# Patient Record
Sex: Male | Born: 2000 | Race: White | Hispanic: No | Marital: Single | Smoking: Current some day smoker
Health system: Southern US, Community
[De-identification: ages and names within clinical notes are randomized; demographics above are authoritative.]

---
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
## 2022-04-04 ENCOUNTER — Other Ambulatory Visit: Payer: Self-pay

## 2022-04-04 ENCOUNTER — Emergency Department (HOSPITAL_COMMUNITY)
Admission: EM | Admit: 2022-04-04 | Discharge: 2022-04-04 | Disposition: A | Discharge status: Home / Self Care | Payer: Worker's Compensation | Attending: Emergency Medicine | Admitting: Emergency Medicine

## 2022-04-04 ENCOUNTER — Encounter (HOSPITAL_COMMUNITY): Payer: Self-pay

## 2022-04-04 DIAGNOSIS — S61412A Laceration without foreign body of left hand, initial encounter: Secondary | ICD-10-CM | POA: Insufficient documentation

## 2022-04-04 DIAGNOSIS — W268XXA Contact with other sharp object(s), not elsewhere classified, initial encounter: Secondary | ICD-10-CM | POA: Diagnosis not present

## 2022-04-04 DIAGNOSIS — Z23 Encounter for immunization: Secondary | ICD-10-CM | POA: Insufficient documentation

## 2022-04-04 DIAGNOSIS — Y990 Civilian activity done for income or pay: Secondary | ICD-10-CM | POA: Diagnosis not present

## 2022-04-04 DIAGNOSIS — S6992XA Unspecified injury of left wrist, hand and finger(s), initial encounter: Secondary | ICD-10-CM | POA: Diagnosis present

## 2022-04-04 MED ORDER — TETANUS-DIPHTH-ACELL PERTUSSIS 5-2.5-18.5 LF-MCG/0.5 IM SUSY
0.5000 mL | PREFILLED_SYRINGE | Freq: Once | INTRAMUSCULAR | Status: AC
  Administered 2022-04-04: 0.5 mL via INTRAMUSCULAR
  Filled 2022-04-04: qty 0.5

## 2022-04-04 NOTE — ED Provider Notes (Signed)
[facility] DEPT Provider Note   CSN: [id number] Arrival date & time: 04/04/22  1025     History  Chief Complaint  Patient presents with   Laceration    Randy Berry is a 21 y.o. male.  21 year old male with laceration to the left hand.  States he cut his hand on a sharp pipe at work.  Bleeding is controlled prior to arrival.  Last tetanus is unknown.  No other injuries.  Denies weakness or numbness in the hand.       Home Medications Prior to Admission medications   Not on File      Allergies    Patient has no known allergies.    Review of Systems   Review of Systems Negative except as per HPI Physical Exam Updated Vital Signs BP (!) 143/95 (BP Location: Left Arm)   Pulse 78   Temp 98.7 F (37.1 C) (Oral)   Ht 5\' 11"  (1.803 m)   Wt 104.3 kg   SpO2 100%   BMI 32.08 kg/m  Physical Exam Vitals and nursing note reviewed.  Constitutional:      General: He is not in acute distress.    Appearance: He is well-developed. He is not diaphoretic.  HENT:     Head: Normocephalic and atraumatic.  Cardiovascular:     Pulses: Normal pulses.  Pulmonary:     Effort: Pulmonary effort is normal.  Musculoskeletal:        General: Signs of injury present. No swelling or deformity. Normal range of motion.     Comments: Less than 1 cm "V" shape laceration to dorsum of left hand along 1st metacarpal. Mild active bleeding, sensation intact, ROM/motor intact.   Skin:    General: Skin is warm and dry.     Findings: No erythema or rash.  Neurological:     Mental Status: He is alert and oriented to person, place, and time.     Sensory: No sensory deficit.     Motor: No weakness.  Psychiatric:        Behavior: Behavior normal.     ED Results / Procedures / Treatments   Labs (all labs ordered are listed, but only abnormal results are displayed) Labs Reviewed - No data to display  EKG None  Radiology No results  found.  Procedures .Marland KitchenLaceration Repair  Date/Time: 04/04/2022 10:49 AM  Performed by: Tacy Learn, PA-C Authorized by: Tacy Learn, PA-C   Consent:    Consent obtained:  Verbal   Consent given by:  Patient   Risks discussed:  Infection, need for additional repair, pain, poor cosmetic result and poor wound healing   Alternatives discussed:  No treatment and delayed treatment Universal protocol:    Procedure explained and questions answered to patient or proxy's satisfaction: yes     Relevant documents present and verified: yes     Test results available: yes     Imaging studies available: yes     Required blood products, implants, devices, and special equipment available: yes     Site/side marked: yes     Immediately prior to procedure, a time out was called: yes     Patient identity confirmed:  Verbally with patient Anesthesia:    Anesthesia method:  None Laceration details:    Location:  Hand   Hand location:  L wrist   Length (cm):  1   Depth (mm):  3 Pre-procedure details:    Preparation:  Patient was prepped and  draped in usual sterile fashion Exploration:    Wound exploration: wound explored through full range of motion and entire depth of wound visualized     Wound extent: no foreign bodies/material noted, no muscle damage noted, no nerve damage noted, no tendon damage noted, no underlying fracture noted and no vascular damage noted     Contaminated: no   Treatment:    Area cleansed with:  Saline   Amount of cleaning:  Standard   Irrigation solution:  Sterile saline Skin repair:    Repair method:  Staples   Number of staples:  2 Approximation:    Approximation:  Close Repair type:    Repair type:  Simple Post-procedure details:    Dressing:  Adhesive bandage   Procedure completion:  Tolerated well, no immediate complications     Medications Ordered in ED Medications  Tdap (BOOSTRIX) injection 0.5 mL (0.5 mLs Intramuscular Given 04/04/22 1041)     ED Course/ Medical Decision Making/ A&P                           Medical Decision Making Risk Prescription drug management.   63 oh male, right-hand-dominant, presents with laceration to the left wrist as above.  Wound is visualized to depth, no limitations in range of motion, sensation intact.  Discussed closure with sutures.  Patient agreeable with staples.  Wound was irrigated, closed with 2 staples without difficulty.  Band-Aid placed after closure.  Recommend recheck with Worker's Comp. provider in 2 days.  Advised to keep wound clean and dry, covered.  Recommend staple removal in 10 days with his Worker's Comp. provider.  Tetanus is updated today, last tetanus unknown.        Final Clinical Impression(s) / ED Diagnoses Final diagnoses:  Laceration of left hand, foreign body presence unspecified, initial encounter    Rx / DC Orders ED Discharge Orders     None         Tacy Learn, PA-C 04/04/22 1051    Tretha Sciara, MD 04/04/22 1132

## 2022-04-04 NOTE — ED Triage Notes (Signed)
Patient reports that he cut his right thumb/hand with a piece of metal at work today.

## 2022-04-04 NOTE — Discharge Instructions (Signed)
Recheck with your workers comp provider in 2 days, staple removal in 10 days. Keep hand clean and dry.

## 2024-05-23 ENCOUNTER — Encounter: Payer: Self-pay | Admitting: Emergency Medicine

## 2024-05-23 ENCOUNTER — Ambulatory Visit
Admission: EM | Admit: 2024-05-23 | Discharge: 2024-05-23 | Disposition: A | Discharge status: Home / Self Care | Source: Home / Self Care

## 2024-05-23 DIAGNOSIS — J209 Acute bronchitis, unspecified: Secondary | ICD-10-CM | POA: Diagnosis not present

## 2024-05-23 LAB — POC COVID19/FLU A&B COMBO
Covid Antigen, POC: NEGATIVE
Influenza A Antigen, POC: NEGATIVE
Influenza B Antigen, POC: NEGATIVE

## 2024-05-23 MED ORDER — PREDNISONE 10 MG (21) PO TBPK: ORAL_TABLET | Freq: Every day | ORAL | 0 refills | Status: AC

## 2024-05-23 MED ORDER — AZITHROMYCIN 250 MG PO TABS: 250.0000 mg | ORAL_TABLET | Freq: Every day | ORAL | 0 refills | Status: AC

## 2024-05-23 MED ORDER — ALBUTEROL SULFATE HFA 108 (90 BASE) MCG/ACT IN AERS: 2.0000 | INHALATION_SPRAY | RESPIRATORY_TRACT | 0 refills | Status: AC | PRN

## 2024-05-23 NOTE — ED Notes (Signed)
 Patient triage by provider Teresa Shelba SAUNDERS, NP

## 2024-05-23 NOTE — ED Provider Notes (Signed)
 [facility]    CSN: [id number] Arrival date & time: 05/23/24  9062      History   Chief Complaint No chief complaint on file.   HPI COSIMO SCHERTZER is a 23 y.o. male.   Patient presents for evaluation of fever peaking at 103, nasal congestion, rhinorrhea, sore throat, productive cough with brown mucus and streaking blood, shortness of breath at rest and intermittent wheezing beginning 3 days ago.  Symptoms progressively worsening over the past 2 days.  Woke up this morning with centralized chest pain feeling as if someone was sitting on the chest wall making it difficult to breathe.  Initial sore throat has resolved.  Tolerable to food and liquids but appetite is decreased.  No known sick contacts prior.  Has attempted use of NyQuil DayQuil and Mucinex.  History of childhood asthma, sometimes tobacco use, former vapor.       No past medical history on file.  There are no active problems to display for this patient.   No past surgical history on file.     Home Medications    Prior to Admission medications  Not on File    Family History Family History  Problem Relation Age of Onset   Hypertension Father     Social History Social History[1]   Allergies   Patient has no known allergies.   Review of Systems Review of Systems  Constitutional:  Positive for fever. Negative for activity change, appetite change, chills, diaphoresis, fatigue and unexpected weight change.  HENT:  Positive for congestion, rhinorrhea and sore throat. Negative for dental problem, drooling, ear discharge, ear pain, facial swelling, hearing loss, mouth sores, nosebleeds, postnasal drip, sinus pressure, sinus pain, sneezing, tinnitus, trouble swallowing and voice change.   Respiratory:  Positive for cough, shortness of breath and wheezing. Negative for apnea, choking, chest tightness and stridor.      Physical Exam Triage Vital Signs ED Triage Vitals  Encounter Vitals Group      BP      Girls Systolic BP Percentile      Girls Diastolic BP Percentile      Boys Systolic BP Percentile      Boys Diastolic BP Percentile      Pulse      Resp      Temp      Temp src      SpO2      Weight      Height      Head Circumference      Peak Flow      Pain Score      Pain Loc      Pain Education      Exclude from Growth Chart    No data found.  Updated Vital Signs There were no vitals taken for this visit.  Visual Acuity Right Eye Distance:   Left Eye Distance:   Bilateral Distance:    Right Eye Near:   Left Eye Near:    Bilateral Near:     Physical Exam Constitutional:      Appearance: Normal appearance.  HENT:     Head: Normocephalic.     Right Ear: Tympanic membrane, ear canal and external ear normal.     Left Ear: Tympanic membrane, ear canal and external ear normal.     Nose: Congestion present.     Mouth/Throat:     Pharynx: No oropharyngeal exudate or posterior oropharyngeal erythema.  Eyes:     Extraocular Movements: Extraocular movements  intact.  Cardiovascular:     Rate and Rhythm: Normal rate and regular rhythm.     Pulses: Normal pulses.     Heart sounds: Normal heart sounds.  Pulmonary:     Effort: Pulmonary effort is normal.     Breath sounds: Normal breath sounds.  Musculoskeletal:     Cervical back: Normal range of motion and neck supple.  Skin:    General: Skin is warm and dry.  Neurological:     Mental Status: He is alert and oriented to person, place, and time. Mental status is at baseline.      UC Treatments / Results  Labs (all labs ordered are listed, but only abnormal results are displayed) Labs Reviewed  POC COVID19/FLU A&B COMBO    EKG   Radiology No results found.  Procedures Procedures (including critical care time)  Medications Ordered in UC Medications - No data to display  Initial Impression / Assessment and Plan / UC Course  I have reviewed the triage vital signs and the nursing  notes.  Pertinent labs & imaging results that were available during my care of the patient were reviewed by me and considered in my medical decision making (see chart for details).  Acute bronchitis  Vital signs are stable, O2 saturation 99% on room air, lungs clear to auscultation, based on presentation concerning for bronchitis low suspicion discussed with patient, EKG showing no sinus rhythm, no prior cardiac history low suspicion for cardiac involvement, COVID and flu testing negative, prescribed azithromycin, prednisone and albuterol inhaler, declined cough medicine, recommended over-the-counter medications and nonpharmacological supportive care and given strict precautions for any persisting or worsening symptoms to follow-up for reevaluation Final Clinical Impressions(s) / UC Diagnoses   Final diagnoses:  Viral illness   Discharge Instructions   None    ED Prescriptions   None    PDMP not reviewed this encounter.     [1]  Social History Tobacco Use   Smoking status: Some Days    Types: Cigarettes  Vaping Use   Vaping status: Every Day   Substances: Nicotine, Flavoring  Substance Use Topics   Alcohol use: Never   Drug use: Never     Teresa Shelba SAUNDERS, NP 05/23/24 1045

## 2024-05-23 NOTE — Discharge Instructions (Signed)
 You are being treated for bronchitis which is an inflammatory condition of the bronchioles (upper airways) which can cause difficulty breathing  EKG shows the heart is beating in a regular pace and rhythm and you are getting enough air without assistance, on exam your lungs are clear I have a low suspicion that you have pneumonia  As a precaution begin azithromycin which would be protective to the airway, take as directed  Begin prednisone every morning with food as directed to open and relax the airway making it easier for you to breathe  Additionally you may use inhaler taking 2 puffs every 4 hours as needed for shortness of breath and wheezing    You can take Tylenol and/or Ibuprofen as needed for fever reduction and pain relief.   For cough: honey 1/2 to 1 teaspoon (you can dilute the honey in water or another fluid).  You can also use guaifenesin and dextromethorphan for cough. You can use a humidifier for chest congestion and cough.  If you don't have a humidifier, you can sit in the bathroom with the hot shower running.      For sore throat: try warm salt water gargles, cepacol lozenges, throat spray, warm tea or water with lemon/honey, popsicles or ice, or OTC cold relief medicine for throat discomfort.   For congestion: take a daily anti-histamine like Zyrtec, Claritin, and a oral decongestant, such as pseudoephedrine.  You can also use Flonase 1-2 sprays in each nostril daily.   It is important to stay hydrated: drink plenty of fluids (water, gatorade/powerade/pedialyte, juices, or teas) to keep your throat moisturized and help further relieve irritation/discomfort.\  If your symptoms continue to persist despite use of treatment please follow-up for reevaluation at any point if your breathing worsens please follow-up for reevaluation
